# Patient Record
Sex: Female | Born: 1950 | Race: White | Hispanic: No | Marital: Single
Health system: Southern US, Community
[De-identification: ages and names within clinical notes are randomized; demographics above are authoritative.]

---

## 1999-02-22 ENCOUNTER — Ambulatory Visit (HOSPITAL_COMMUNITY): Admission: RE | Admit: 1999-02-22 | Discharge: 1999-02-22 | Payer: Self-pay | Admitting: General Surgery

## 1999-02-22 ENCOUNTER — Encounter (INDEPENDENT_AMBULATORY_CARE_PROVIDER_SITE_OTHER): Payer: Self-pay

## 2000-10-22 ENCOUNTER — Encounter: Payer: Self-pay | Admitting: Family Medicine

## 2000-10-22 ENCOUNTER — Other Ambulatory Visit: Admission: RE | Admit: 2000-10-22 | Discharge: 2000-10-22 | Payer: Self-pay | Admitting: *Deleted

## 2002-04-28 ENCOUNTER — Other Ambulatory Visit: Admission: RE | Admit: 2002-04-28 | Discharge: 2002-04-28 | Payer: Self-pay | Admitting: *Deleted

## 2003-05-26 ENCOUNTER — Encounter: Payer: Self-pay | Admitting: Family Medicine

## 2003-05-26 ENCOUNTER — Other Ambulatory Visit: Admission: RE | Admit: 2003-05-26 | Discharge: 2003-05-26 | Payer: Self-pay | Admitting: *Deleted

## 2005-08-01 ENCOUNTER — Other Ambulatory Visit: Admission: RE | Admit: 2005-08-01 | Discharge: 2005-08-01 | Payer: Self-pay | Admitting: Obstetrics & Gynecology

## 2005-08-01 ENCOUNTER — Encounter: Payer: Self-pay | Admitting: Family Medicine

## 2005-08-08 ENCOUNTER — Encounter: Admission: RE | Admit: 2005-08-08 | Discharge: 2005-08-08 | Payer: Self-pay | Admitting: Family Medicine

## 2005-08-13 ENCOUNTER — Encounter: Admission: RE | Admit: 2005-08-13 | Discharge: 2005-08-13 | Payer: Self-pay | Admitting: Family Medicine

## 2005-09-12 ENCOUNTER — Ambulatory Visit (HOSPITAL_COMMUNITY): Admission: RE | Admit: 2005-09-12 | Discharge: 2005-09-12 | Payer: Self-pay | Admitting: Gastroenterology

## 2005-10-23 ENCOUNTER — Encounter: Admission: RE | Admit: 2005-10-23 | Discharge: 2005-10-23 | Payer: Self-pay | Admitting: Family Medicine

## 2006-01-29 ENCOUNTER — Encounter: Payer: Self-pay | Admitting: Family Medicine

## 2006-02-25 ENCOUNTER — Ambulatory Visit: Payer: Self-pay | Admitting: Family Medicine

## 2006-02-26 ENCOUNTER — Ambulatory Visit: Payer: Self-pay | Admitting: Family Medicine

## 2006-03-02 ENCOUNTER — Encounter: Admission: RE | Admit: 2006-03-02 | Discharge: 2006-03-02 | Payer: Self-pay | Admitting: Family Medicine

## 2006-03-09 ENCOUNTER — Ambulatory Visit: Payer: Self-pay | Admitting: Family Medicine

## 2006-03-13 ENCOUNTER — Ambulatory Visit: Payer: Self-pay | Admitting: Internal Medicine

## 2006-05-06 ENCOUNTER — Ambulatory Visit: Payer: Self-pay | Admitting: Family Medicine

## 2006-05-15 ENCOUNTER — Ambulatory Visit: Payer: Self-pay

## 2006-06-08 ENCOUNTER — Ambulatory Visit: Payer: Self-pay | Admitting: Gastroenterology

## 2006-06-09 ENCOUNTER — Ambulatory Visit: Payer: Self-pay | Admitting: *Deleted

## 2006-06-19 ENCOUNTER — Ambulatory Visit: Payer: Self-pay | Admitting: Gastroenterology

## 2006-06-30 ENCOUNTER — Encounter (INDEPENDENT_AMBULATORY_CARE_PROVIDER_SITE_OTHER): Payer: Self-pay | Admitting: Specialist

## 2006-06-30 ENCOUNTER — Ambulatory Visit: Payer: Self-pay | Admitting: Gastroenterology

## 2006-09-02 DIAGNOSIS — G43909 Migraine, unspecified, not intractable, without status migrainosus: Secondary | ICD-10-CM | POA: Insufficient documentation

## 2007-03-01 ENCOUNTER — Encounter: Payer: Self-pay | Admitting: Family Medicine

## 2007-03-12 IMAGING — US US ABDOMEN COMPLETE
1 series · 14 of 25 positions shown · non-contrast
Comparison: None.

CLINICAL DATA: Epigastric pain with nausea. 
 COMPLETE ABDOMINAL ULTRASOUND:
TECHNIQUE: Complete abdominal ultrasound examination was performed including evaluation of the liver, gallbladder, bile ducts, pancreas, kidneys, spleen, IVC, and abdominal aorta.

[Series 1: us abdomen complete · 0.43mm/px · 14 of 84 slices shown]
[im 1/84]
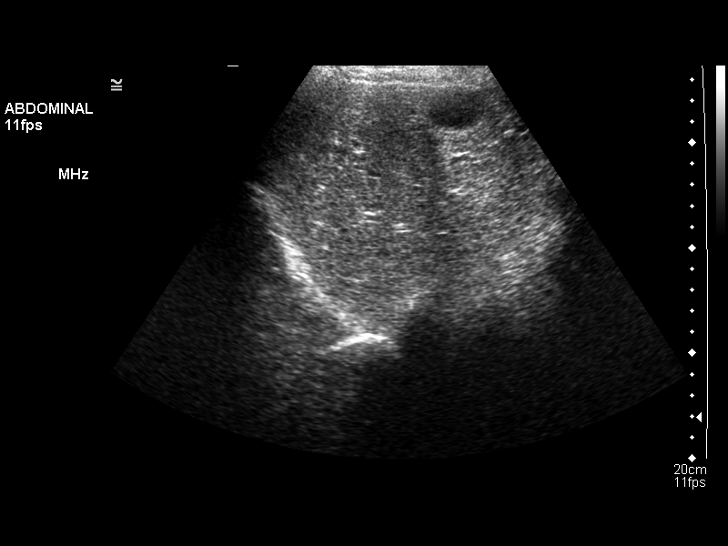
[im 7/84]
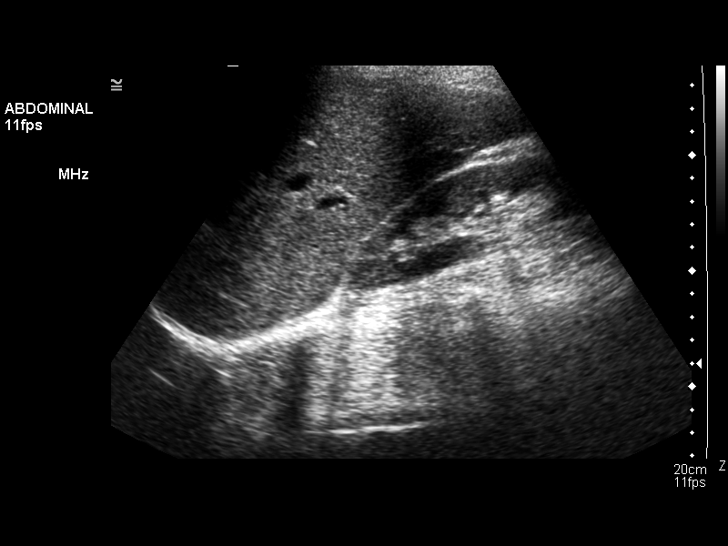
[im 14/84]
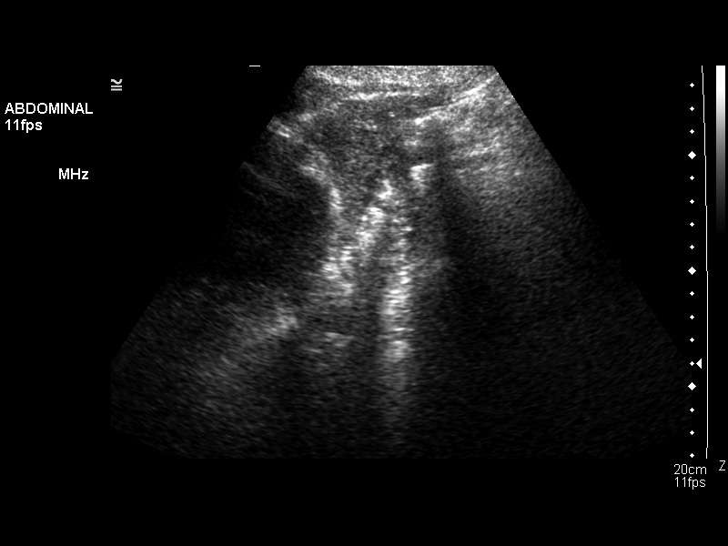
[im 21/84]
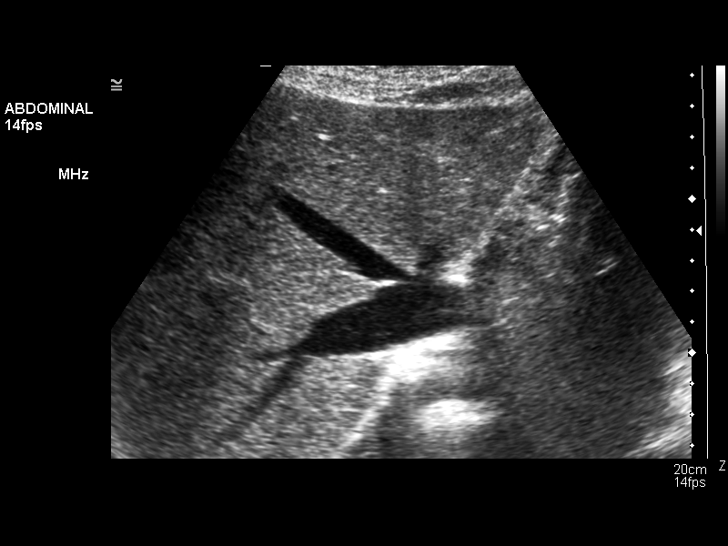
[im 28/84]
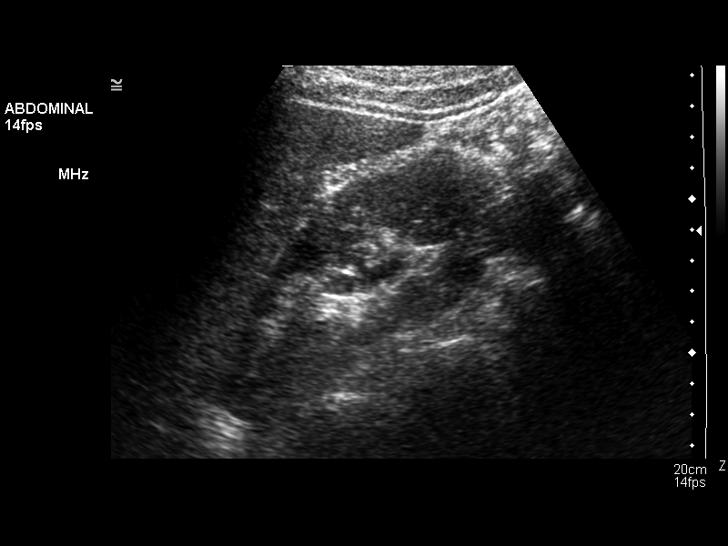
[im 32/84]
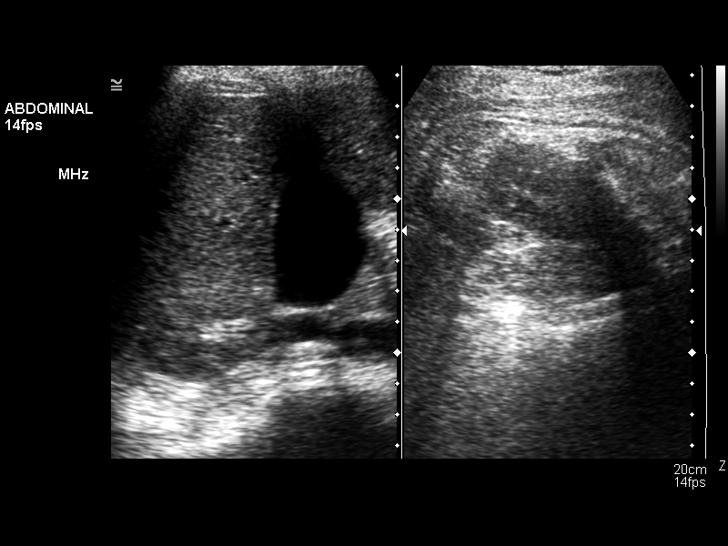
[im 39/84]
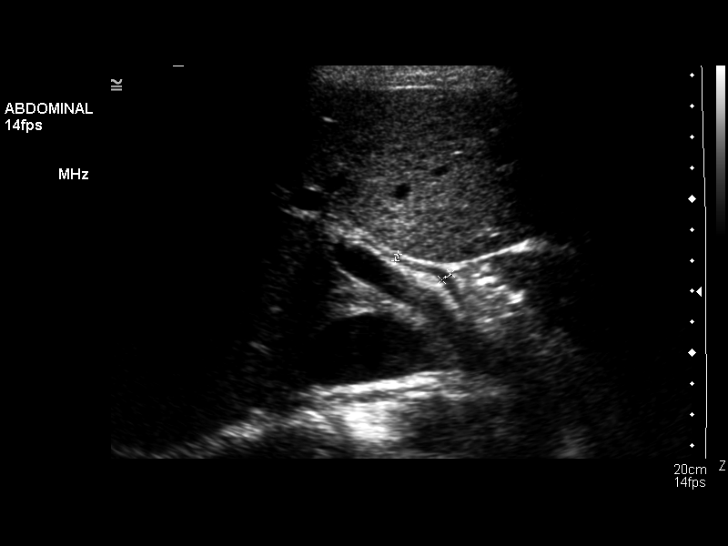
[im 45/84]
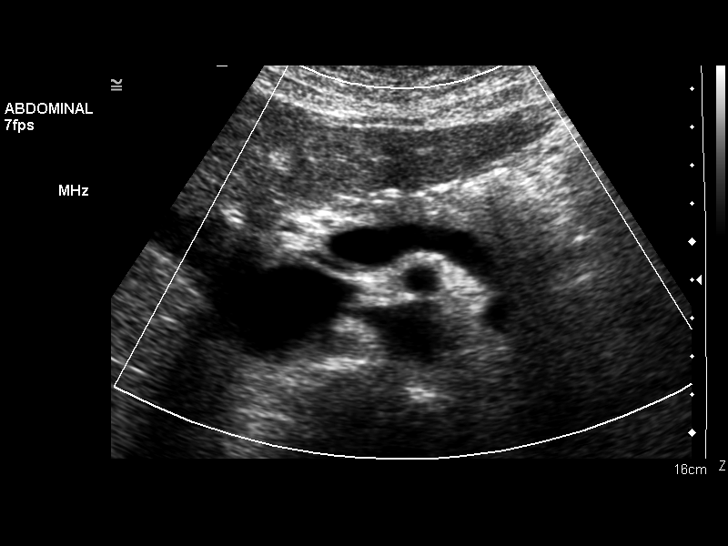
[im 52/84]
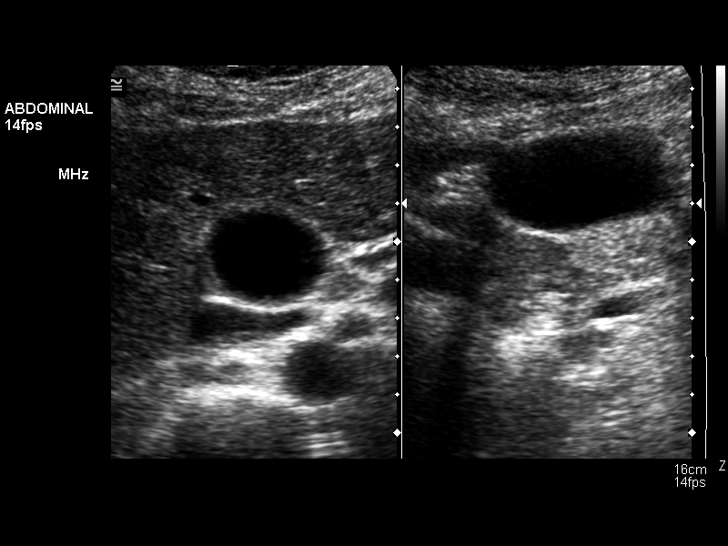
[im 56/84]
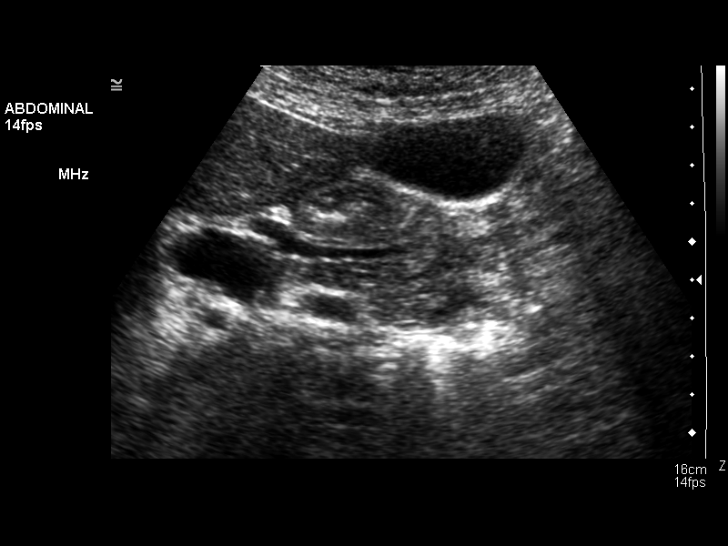
[im 63/84]
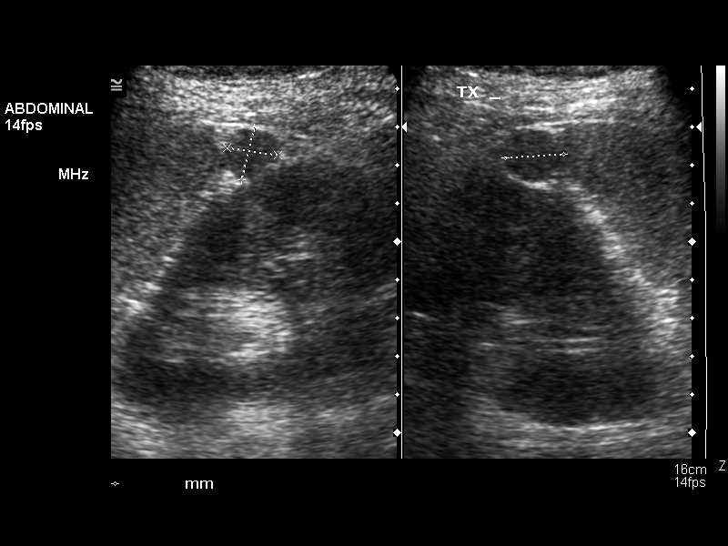
[im 70/84]
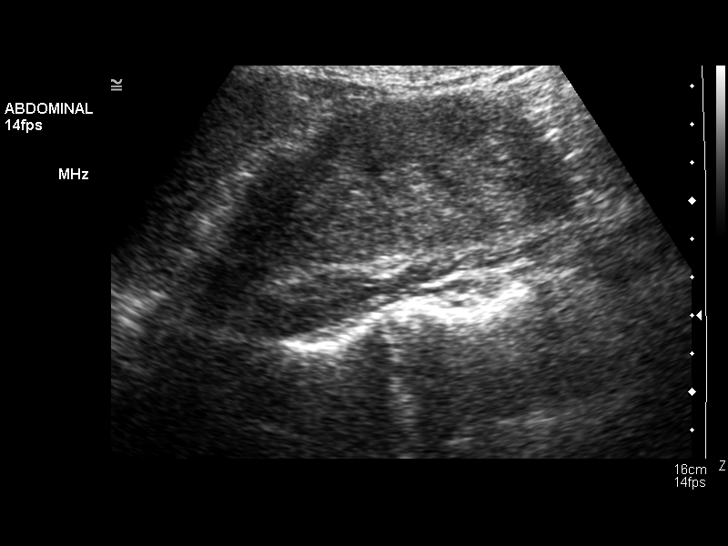
[im 77/84]
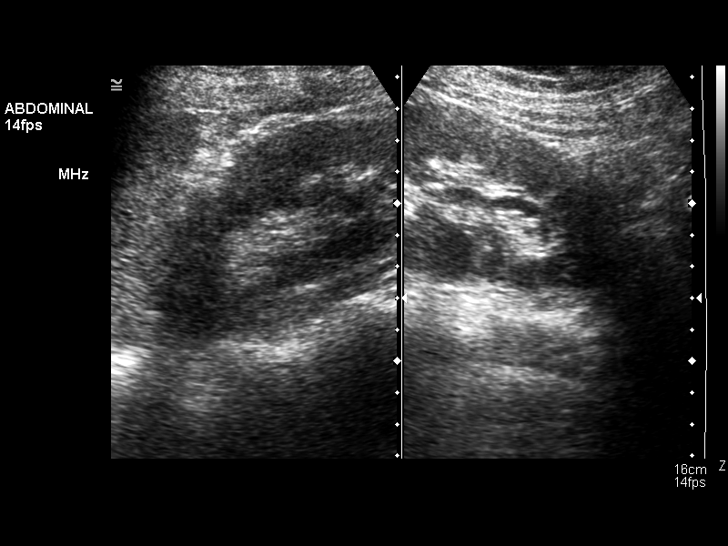
[im 84/84]
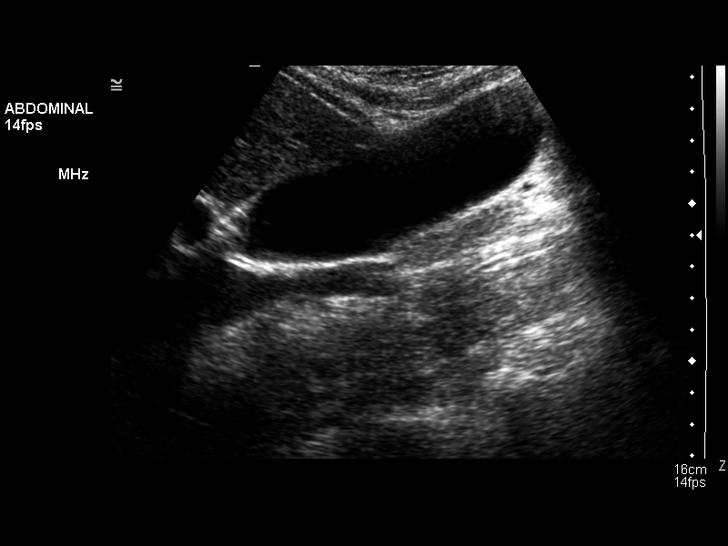

[14 of 25 positions shown; findings below may reference images not displayed]

FINDINGS: Gallbladder and biliary tree normal.  Intrahepatic ducts normal in caliber.  Pancreas, liver, and spleen normal.  There is a probable small accessory spleen measuring about 1.5cm.  Kidney size within normal limits.  No stones or obstruction.  IVC and aorta normal.  No ascites.
IMPRESSION: Normal exam.  There is a probable small accessory splenule.

## 2007-12-03 ENCOUNTER — Encounter: Payer: Self-pay | Admitting: Family Medicine

## 2009-03-15 ENCOUNTER — Other Ambulatory Visit: Admission: RE | Admit: 2009-03-15 | Discharge: 2009-03-15 | Payer: Self-pay | Admitting: Family Medicine

## 2009-03-15 ENCOUNTER — Encounter: Payer: Self-pay | Admitting: Family Medicine

## 2009-03-15 ENCOUNTER — Ambulatory Visit: Payer: Self-pay | Admitting: Family Medicine

## 2009-03-20 ENCOUNTER — Encounter (INDEPENDENT_AMBULATORY_CARE_PROVIDER_SITE_OTHER): Payer: Self-pay | Admitting: *Deleted

## 2009-03-22 ENCOUNTER — Encounter (INDEPENDENT_AMBULATORY_CARE_PROVIDER_SITE_OTHER): Payer: Self-pay | Admitting: *Deleted

## 2009-03-30 ENCOUNTER — Ambulatory Visit: Payer: Self-pay | Admitting: Family Medicine

## 2009-04-03 ENCOUNTER — Encounter (INDEPENDENT_AMBULATORY_CARE_PROVIDER_SITE_OTHER): Payer: Self-pay | Admitting: *Deleted

## 2009-04-03 LAB — CONVERTED CEMR LAB: OCCULT 3: NEGATIVE

## 2009-05-09 ENCOUNTER — Encounter: Payer: Self-pay | Admitting: Family Medicine

## 2009-05-11 ENCOUNTER — Encounter: Payer: Self-pay | Admitting: Family Medicine

## 2010-09-01 LAB — CONVERTED CEMR LAB
ALT: 24 units/L (ref 0–35)
BUN: 11 mg/dL (ref 6–23)
Blood in Urine, dipstick: NEGATIVE
Chloride: 106 meq/L (ref 96–112)
Creatinine, Ser: 0.6 mg/dL (ref 0.4–1.2)
Eosinophils Absolute: 0.1 10*3/uL (ref 0.0–0.7)
Eosinophils Relative: 3 % (ref 0.0–5.0)
Glucose, Urine, Semiquant: NEGATIVE
HCT: 41.5 % (ref 36.0–46.0)
Lymphocytes Relative: 49.7 % — ABNORMAL HIGH (ref 12.0–46.0)
MCV: 92.9 fL (ref 78.0–100.0)
Monocytes Absolute: 0.3 10*3/uL (ref 0.1–1.0)
Neutro Abs: 1.6 10*3/uL (ref 1.4–7.7)
Neutrophils Relative %: 38.5 % — ABNORMAL LOW (ref 43.0–77.0)
Nitrite: NEGATIVE
Total Bilirubin: 0.8 mg/dL (ref 0.3–1.2)
Total CHOL/HDL Ratio: 4
Urobilinogen, UA: NEGATIVE
VLDL: 31 mg/dL (ref 0.0–40.0)
WBC: 4.2 10*3/uL — ABNORMAL LOW (ref 4.5–10.5)
pH: 5

## 2010-12-20 NOTE — Assessment & Plan Note (Signed)
[facility]                          [place] OFFICE NOTE   Elizabeth Church, Elizabeth Church                       MRN:          [id number]  DATE:02/25/2006                            DOB:          November 18, 1950    REASON FOR VISIT:  Chest pain.   HISTORY OF PRESENT ILLNESS:  Elizabeth Church is a 60 year old [place] female whose  English is very limited, but she is here with her son who helped translate.  According to Elizabeth Church, through the translation of her son, she was being  treated for migraines by her neurologist back in May.  She was started on  nortriptyline and Topamax.  One month after the medicines were started, she  started complaining of chest pressure associated with palpitations.  She  went ahead and called her neurologist, who recommended she stop  nortriptyline.  According to the patient, the palpitations resolved but she  continued to have intermittent chest pressure.  The patient describes it as  a chest tightness that lasts 5 to 7 minutes.  It is not associated with  activity, i.e., it can occur at any time.  She does reveal that she has been  under a lot of stress recently, and does not know if this contributes to her  chest discomfort.  She denied any numbness, diaphoresis or dizziness.  She  did report one episode of nausea with chest pain last week.  She denied any  chest discomfort this week.   PAST MEDICAL HISTORY:  1.  Migraine.  2.  Gastroesophageal reflux with a history of H. pylori.   SURGICAL HISTORY:  None.   MEDICATIONS:  1.  Topamax 20 mg.  2.  Metoclopramide 10 mg.  3.  Xanax 0.25 mg daily p.r.n.  4.  Nortriptyline 25 mg, which was discontinued a month ago.   ALLERGIES:  No known drug allergies.   FAMILY HISTORY:  Mother alive and well.  She has one brother who is also  alive and well.   SOCIAL HISTORY:  She works at [employer].  She is married with two  children, denies any tobacco or alcohol use.   REVIEW OF SYSTEMS:  As per HPI.  Additionally, the patient denied any  respiratory, GI, GU, musculoskeletal, neurologic or psychiatric symptoms.   PHYSICAL EXAMINATION:  VITAL SIGNS:  Weight of 153, pulse is 60, blood  pressure 122/70.  GENERAL:  We have a pleasant female in no acute distress, answers questions  appropriately.  HEENT:  Unremarkable.  NECK:  Supple, no lymphadenopathy, carotid bruits or JVD.  No thyromegaly  was noted.  LUNGS:  Clear with good air movement.  HEART:  Regular rate and rhythm with normal S1 and S2, no murmurs, gallops  or rubs on my examination.  ABDOMEN:  Benign.  EXTREMITIES:  No cyanosis, clubbing or edema.   EKG showed a normal sinus rhythm with a ventricular rate of 68.  No ST  elevations or depressions were noted.  No Q waves were appreciated.  No PVCs  or PACs were noted on the rhythm strip.   IMPRESSION:  60 year old female with  24-month history of intermittent chest  pain and palpitations.  The palpitations resolved after discontinuing  nortriptyline, but continue with intermittent chest discomfort.  The  patient's only cardiac risk factor is being postmenopausal.  Given the  limitation on our communication, I feel further cardiac evaluation is  warranted.   PLAN:  1.  I will obtain a basic metabolic profile, lipid panel, TSH and CBC.  2.  Will refer patient to cardiology for further evaluation.  3.  We will also order a chest x-ray PA and lateral.  4.  Precautions were reviewed with both patient and son.                                   Leanne Chang, MD   LA/MedQ  DD:  02/25/2006  DT:  02/26/2006  Job #:  [id number]

## 2010-12-20 NOTE — Assessment & Plan Note (Signed)
[facility]                              CARDIOLOGY OFFICE NOTE   Elizabeth Church, Elizabeth Church                       MRN:          [id number]  DATE:03/13/2006                            DOB:          24-Apr-1951    CARDIOLOGY CONSULTATION:   SUBJECTIVE:  Elizabeth Church is a very pleasant 60 year old [place] female who is  referred to [place] today by Leanne Chang, MD, for the evaluation of chest  pain.  The patient was recently placed on nortriptyline and Topamax for  migraines.  Shortly after this she developed chest pressure and  palpitations.  Her nortriptyline was discontinued but she continued to have  chest pressure.  She does not speak very much [place] and her son is with  her today, who translates.  Apparently this pressure sensation radiates from  her epigastrium up her entire chest and sometimes into her neck.  It can  last anywhere from 10-15 minutes.  She denies any radiation down her arms.  She denies any associated shortness of breath, nausea or diaphoresis.  She  denies any syncope or presyncope.  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  She does not have any exertional  symptoms, nor are her symptoms worsened by exertion.   PAST MEDICAL HISTORY:  1. Migraine headaches.  2. Gastroesophageal reflux disease with a history of H. pylori.  3. She had a recent chest x-ray that was significant for changes      associated by COPD.  She was placed on Advair in response to this.  4. She denies any surgeries.  5. She has a history of osteopenia.   CURRENT MEDICATIONS:  1. Topamax 25 mg two tablets twice a day.  2. Calcium 600 mg twice a day.  3. B12 1 g daily.   ALLERGIES:  She has had side effects to LEXAPRO.   SOCIAL HISTORY:  The patient denies any current tobacco abuse.  She quit 2  years ago.  She has a 20 pack-year history.  She denies alcohol abuse.  She  is a Nature conservation officer at [employer], [place] [address].   She is married and  has 2 grown sons.  She has been in the [place] for the past 9 years  and is formerly from [place]   FAMILY HISTORY:  Insignificant for coronary artery disease.   REVIEW OF SYSTEMS:  Please see HPI.  She denies any fevers, chills, cough,  melena, hematochezia, hematuria, dysuria, dysphagia, odynophagia.  The rest  of the review of systems is negative.   PHYSICAL EXAMINATION:  GENERAL:  She is a well-nourished, well-developed  female in no acute distress.  VITAL SIGNS:  Blood pressure is 112/72, pulse 72, weight 148 pounds.  HEENT:  Head normocephalic, atraumatic.  Eyes:  PERRLA, EOMI, sclerae are  clear.  NECK:  Without lymphadenopathy.  Carotids are 2+ bilaterally without bruits.  ENDOCRINE:  Without thyromegaly.  CARDIAC:  Normal S1, S2.  Regular rate and rhythm without murmurs, clicks,  rubs or gallops.  LUNGS:  Clear to auscultation bilaterally without wheeze, rhonchi or  rales.  ABDOMEN:  Soft, nontender, with normoactive bowel sounds, no organomegaly.  EXTREMITIES:  Without edema.  Calves are soft, nontender bilaterally.  Femoral artery pulses are 2+ bilaterally without bruits.  Dorsalis pedis and  posterior tibialis pulses are 2+ bilaterally.  SKIN:  Warm and dry.  NEUROLOGIC:  She is alert and oriented x3.  Cranial nerves II-XII are  grossly intact.  No focal deficits.   Electrocardiogram reveals sinus rhythm with a heart rate of 72.  Normal  axis.  No acute changes.   IMPRESSION:  1. Atypical chest pain.  2. Migraine headaches.  3. Chronic obstructive pulmonary disease.  4. Osteopenia.  5. Ex-smoker.   PLAN:  The patient was also interviewed and examined by Dr. Tenny Craw.  Her  symptoms are quite atypical and she lacks several risk factors.  She had  recent blood work performed that showed total cholesterol 221, triglycerides  58, HDL of 67.1 and LDL of 131.  At this point in time we have elected to  place her on a proton pump inhibitor with  Protonix 40 mg a day.  We will be  in touch with the patient by phone.  If she shows no improvement with this,  then we will set her up with a stress Myoview study.                                  Tereso Newcomer, PA-C                             Pricilla Riffle, MD, [facility]   SW/MedQ  DD:  03/13/2006  DT:  03/14/2006  Job #:  [id number]   cc:   Leanne Chang, MD

## 2010-12-20 NOTE — Assessment & Plan Note (Signed)
[facility]                           GASTROENTEROLOGY OFFICE NOTE   DOYLENE, SPLINTER                       MRN:          [id number]  DATE:06/08/2006                            DOB:          May 12, 1951    REFERRING PHYSICIAN:  Leanne Chang, M.D.   REASON FOR REFERRAL:  Dr. Blossom Hoops asked me to evaluate Ms. Elizabeth Church in  consultation regarding one year of abdominal pain, and weight loss.   HISTORY OF PRESENT ILLNESS:  Ms. Bergstresser is a pleasant 60 year old [place]  woman who speaks very limited [place]. She is here today with her son for  translation. She has had approximately 1 year of intermittent epigastric  pains. She describes the pain as burning, crampy pain located in her upper  epigastrium. The pain lasts for 15-20 minutes and seems to go away. It seems  to go away on its own. She does believe that eating seems to be the most  reliable cause of the pain, although it can happen not related to eating.  The pain happens 3-4 times a day. She does have some mild nausea with it.  She has no fevers or chills. She has had a decrease in appetite and has  noticed an approximately 30 pound weight loss in the past year since this  pain began.   The pain has been worked up fairly extensively by lab tests, imaging studies  and by gastroenterologist, Dr. Charlott Rakes. Lab tests most recently  done July 2007 show an essentially normal CBC and normal liver tests. She  had a CT scan done January 2007 that was essentially normal except for  prominent mucosa in the fundus of the stomach. She had an ultrasound around  the same time showing a normal biliary system. She was evaluated by Dr.  Doy Mince, gastroenterologist, [facility], who eventually  performed an EGD on her. She tells me this was normal, I do not have that  report here today but we are going to get her to sign a release so we can  get that information. The EGD was done in  March of 2007.   She has tried Protonix taking it in the morning generally not in relation to  eating her food, sometimes before breakfast, sometimes after breakfast,  sometimes not with breakfast at all. She also tried Reglan. Neither of these  seemed to help very much.   REVIEW OF SYSTEMS:  Notable for a 30 pound weight loss in the past year  otherwise essentially normal and is available on her nurse intake sheet.   PAST MEDICAL HISTORY:  None.   CURRENT MEDICATIONS:  None.   SOCIAL HISTORY:  Married with 2 children, nonsmoker, nondrinker.   FAMILY HISTORY:  No colon cancer or colon polyps in the family.   ASSESSMENT/PLAN:  A 60 year old woman with 1 year of daily intermittent  epigastric discomfort.   She has had a pretty thorough workup although a lot of it was almost a year  ago. She has normal liver tests, normal CBC, normal right upper quadrant  ultrasound,relatively normal CT scan. She had what she tells  me was a normal  EGD by Dr. Charlott Rakes in March of 2007. She had a normal gastric  emptying scan in February 2007. Perhaps her symptoms are acid related. She  has not been taking Protonix at the correct time in relation to food and so  I recommended that she begin taking 40 mg p.o. Protonix 20-30 minutes prior  to her dinner meal which is her most reliable meal of the day. I am also  putting her on Levbid 0.375 mg twice daily on the chance this might be  functional/bowel spasm. I am also repeating a CT scan that was done almost a  year ago and given her weight loss I am a bit concerned about the  possibility of occult malignancy. She will get a CT scan of her abdomen and  pelvis with IV and oral contrast. I am also repeating some of her blood work  including a complete metabolic profile and a CBC and thyroid studies. She is  very interested in getting a repeat EGD although without even having her  previous report from Dr. Bosie Clos I think that may be jumping the gun  a bit,  it may come to that.  She will return to see me in 4 weeks' time and sooner  if needed.    ______________________________  Rachael Fee, MD    DPJ/MedQ  DD: 06/08/2006  DT: 06/09/2006  Job #: [id number]   cc:   Leanne Chang, M.D.

## 2015-03-09 ENCOUNTER — Other Ambulatory Visit: Payer: Self-pay | Admitting: Obstetrics and Gynecology

## 2015-03-13 LAB — CYTOLOGY - PAP

## 2015-04-12 ENCOUNTER — Encounter (HOSPITAL_COMMUNITY): Payer: Self-pay

## 2015-10-19 DIAGNOSIS — E7800 Pure hypercholesterolemia, unspecified: Secondary | ICD-10-CM | POA: Insufficient documentation

## 2015-10-19 DIAGNOSIS — B351 Tinea unguium: Secondary | ICD-10-CM | POA: Insufficient documentation

## 2016-05-01 ENCOUNTER — Other Ambulatory Visit: Payer: Self-pay | Admitting: Family Medicine

## 2016-05-02 LAB — CMP12+LP+TP+TSH+6AC+CBC/D/PLT
ALBUMIN: 4.2 g/dL (ref 3.6–4.8)
ALK PHOS: 93 IU/L (ref 39–117)
ALT: 24 IU/L (ref 0–32)
AST: 21 IU/L (ref 0–40)
Albumin/Globulin Ratio: 1.8 (ref 1.2–2.2)
BASOS ABS: 0 10*3/uL (ref 0.0–0.2)
BILIRUBIN TOTAL: 0.4 mg/dL (ref 0.0–1.2)
BUN / CREAT RATIO: 20 (ref 12–28)
BUN: 11 mg/dL (ref 8–27)
Basos: 1 %
CALCIUM: 9.3 mg/dL (ref 8.7–10.3)
CHLORIDE: 100 mmol/L (ref 96–106)
CHOLESTEROL TOTAL: 204 mg/dL — AB (ref 100–199)
Chol/HDL Ratio: 2.2 ratio units (ref 0.0–4.4)
Creatinine, Ser: 0.56 mg/dL — ABNORMAL LOW (ref 0.57–1.00)
EOS (ABSOLUTE): 0.1 10*3/uL (ref 0.0–0.4)
EOS: 2 %
FREE THYROXINE INDEX: 1.8 (ref 1.2–4.9)
GFR calc non Af Amer: 100 mL/min/{1.73_m2} (ref >59)
GFR, EST AFRICAN AMERICAN: 115 mL/min/{1.73_m2} (ref >59)
GGT: 33 IU/L (ref 0–60)
GLUCOSE: 77 mg/dL (ref 65–99)
Globulin, Total: 2.4 g/dL (ref 1.5–4.5)
HDL: 93 mg/dL (ref >39)
HEMOGLOBIN: 13 g/dL (ref 11.1–15.9)
Hematocrit: 40.2 % (ref 34.0–46.6)
IMMATURE GRANULOCYTES: 0 %
IRON: 91 ug/dL (ref 27–139)
Immature Grans (Abs): 0 10*3/uL (ref 0.0–0.1)
LDH: 182 IU/L (ref 119–226)
LDL CALC: 98 mg/dL (ref 0–99)
LYMPHS: 43 %
Lymphocytes Absolute: 1.9 10*3/uL (ref 0.7–3.1)
MCH: 29.5 pg (ref 26.6–33.0)
MCHC: 32.3 g/dL (ref 31.5–35.7)
MCV: 91 fL (ref 79–97)
MONOCYTES: 6 %
Monocytes Absolute: 0.3 10*3/uL (ref 0.1–0.9)
NEUTROS PCT: 48 %
Neutrophils Absolute: 2.1 10*3/uL (ref 1.4–7.0)
PLATELETS: 256 10*3/uL (ref 150–379)
Phosphorus: 3 mg/dL (ref 2.5–4.5)
Potassium: 3.8 mmol/L (ref 3.5–5.2)
RBC: 4.4 x10E6/uL (ref 3.77–5.28)
RDW: 13.2 % (ref 12.3–15.4)
Sodium: 141 mmol/L (ref 134–144)
T3 Uptake Ratio: 25 % (ref 24–39)
T4, Total: 7.3 ug/dL (ref 4.5–12.0)
TOTAL PROTEIN: 6.6 g/dL (ref 6.0–8.5)
TSH: 1.89 u[IU]/mL (ref 0.450–4.500)
Triglycerides: 65 mg/dL (ref 0–149)
URIC ACID: 3 mg/dL (ref 2.5–7.1)
VLDL CHOLESTEROL CAL: 13 mg/dL (ref 5–40)
WBC: 4.4 10*3/uL (ref 3.4–10.8)

## 2016-05-02 LAB — HGB A1C W/O EAG: Hgb A1c MFr Bld: 5.4 % (ref 4.8–5.6)

## 2016-08-22 DIAGNOSIS — K219 Gastro-esophageal reflux disease without esophagitis: Secondary | ICD-10-CM | POA: Insufficient documentation

## 2017-04-30 ENCOUNTER — Ambulatory Visit: Payer: Self-pay | Admitting: *Deleted

## 2017-04-30 VITALS — BP 123/82 | Ht 64.0 in | Wt 180.0 lb

## 2017-04-30 DIAGNOSIS — R7989 Other specified abnormal findings of blood chemistry: Secondary | ICD-10-CM

## 2017-04-30 DIAGNOSIS — Z0000 Encounter for general adult medical examination without abnormal findings: Secondary | ICD-10-CM

## 2017-04-30 NOTE — Progress Notes (Signed)
[employer] insurance premium discount evaluation: Labs Drawn. Replacements ROI form signed. Tobacco Free Attestation form signed.  Forms placed in paper chart.  Ok to route results to pcp per pt. 

## 2017-05-01 LAB — CMP12+LP+TP+TSH+6AC+CBC/D/PLT
ALBUMIN: 4.4 g/dL (ref 3.6–4.8)
ALK PHOS: 103 IU/L (ref 39–117)
ALT: 22 IU/L (ref 0–32)
AST: 21 IU/L (ref 0–40)
Albumin/Globulin Ratio: 2.1 (ref 1.2–2.2)
BASOS ABS: 0 10*3/uL (ref 0.0–0.2)
BASOS: 0 %
BILIRUBIN TOTAL: 0.4 mg/dL (ref 0.0–1.2)
BUN / CREAT RATIO: 16 (ref 12–28)
BUN: 10 mg/dL (ref 8–27)
CHOLESTEROL TOTAL: 188 mg/dL (ref 100–199)
Calcium: 9.2 mg/dL (ref 8.7–10.3)
Chloride: 103 mmol/L (ref 96–106)
Chol/HDL Ratio: 2.3 ratio (ref 0.0–4.4)
Creatinine, Ser: 0.63 mg/dL (ref 0.57–1.00)
EOS (ABSOLUTE): 0 10*3/uL (ref 0.0–0.4)
Eos: 1 %
Estimated CHD Risk: <0.5 times avg. (ref 0.0–1.0)
FREE THYROXINE INDEX: 2 (ref 1.2–4.9)
GFR calc non Af Amer: 95 mL/min/{1.73_m2} (ref >59)
GFR, EST AFRICAN AMERICAN: 110 mL/min/{1.73_m2} (ref >59)
GGT: 33 IU/L (ref 0–60)
GLOBULIN, TOTAL: 2.1 g/dL (ref 1.5–4.5)
Glucose: 87 mg/dL (ref 65–99)
HDL: 81 mg/dL (ref >39)
HEMATOCRIT: 39.8 % (ref 34.0–46.6)
Hemoglobin: 13.2 g/dL (ref 11.1–15.9)
Immature Grans (Abs): 0 10*3/uL (ref 0.0–0.1)
Immature Granulocytes: 0 %
Iron: 88 ug/dL (ref 27–139)
LDH: 149 IU/L (ref 119–226)
LDL CALC: 94 mg/dL (ref 0–99)
LYMPHS: 43 %
Lymphocytes Absolute: 1.8 10*3/uL (ref 0.7–3.1)
MCH: 29.6 pg (ref 26.6–33.0)
MCHC: 33.2 g/dL (ref 31.5–35.7)
MCV: 89 fL (ref 79–97)
Monocytes Absolute: 0.3 10*3/uL (ref 0.1–0.9)
Monocytes: 7 %
NEUTROS PCT: 49 %
Neutrophils Absolute: 2 10*3/uL (ref 1.4–7.0)
PLATELETS: 242 10*3/uL (ref 150–379)
Phosphorus: 2.9 mg/dL (ref 2.5–4.5)
Potassium: 4.2 mmol/L (ref 3.5–5.2)
RBC: 4.46 x10E6/uL (ref 3.77–5.28)
RDW: 13 % (ref 12.3–15.4)
SODIUM: 140 mmol/L (ref 134–144)
T3 UPTAKE RATIO: 25 % (ref 24–39)
T4, Total: 8.1 ug/dL (ref 4.5–12.0)
TRIGLYCERIDES: 64 mg/dL (ref 0–149)
TSH: 1.5 u[IU]/mL (ref 0.450–4.500)
Total Protein: 6.5 g/dL (ref 6.0–8.5)
Uric Acid: 2.7 mg/dL (ref 2.5–7.1)
VLDL CHOLESTEROL CAL: 13 mg/dL (ref 5–40)
WBC: 4.2 10*3/uL (ref 3.4–10.8)

## 2017-05-01 LAB — VITAMIN D 25 HYDROXY (VIT D DEFICIENCY, FRACTURES): Vit D, 25-Hydroxy: 27.9 ng/mL — ABNORMAL LOW (ref 30.0–100.0)

## 2017-05-01 LAB — HGB A1C W/O EAG: HEMOGLOBIN A1C: 5.4 % (ref 4.8–5.6)

## 2017-05-01 NOTE — Progress Notes (Signed)
Results reviewed with pt. Vit D slightly low. Taking supplement at home  daily. Advised she could increase to  daily and also 5min/day sunlight on skin. All other labs WNL. Diet and exercise recommendations for wt management (BMI 31) discussed. Routine f/u with pcp. Copy of labs provided to pt. Results routed to pcp per pt request. No further questions concerns.

## 2018-10-22 ENCOUNTER — Other Ambulatory Visit: Payer: Self-pay

## 2018-10-22 ENCOUNTER — Ambulatory Visit: Payer: Self-pay | Admitting: *Deleted

## 2018-10-22 VITALS — BP 119/79 | HR 70 | Ht 63.0 in | Wt 176.0 lb

## 2018-10-22 DIAGNOSIS — R7989 Other specified abnormal findings of blood chemistry: Secondary | ICD-10-CM

## 2018-10-22 DIAGNOSIS — Z0000 Encounter for general adult medical examination without abnormal findings: Secondary | ICD-10-CM

## 2018-10-22 DIAGNOSIS — Z79899 Other long term (current) drug therapy: Secondary | ICD-10-CM

## 2018-10-22 NOTE — Progress Notes (Signed)
[employer] insurance premium discount evaluation: Labs Drawn. Replacements ROI form signed. Tobacco Free Attestation form signed.  Forms placed in paper chart. Okay to route results to Rikki Spearing MD per pt.

## 2018-10-23 LAB — CMP12+LP+TP+TSH+6AC+CBC/D/PLT
A/G RATIO: 1.9 (ref 1.2–2.2)
ALBUMIN: 4.3 g/dL (ref 3.8–4.8)
ALT: 20 IU/L (ref 0–32)
AST: 19 IU/L (ref 0–40)
Alkaline Phosphatase: 96 IU/L (ref 39–117)
BASOS ABS: 0 10*3/uL (ref 0.0–0.2)
BILIRUBIN TOTAL: 0.5 mg/dL (ref 0.0–1.2)
BUN/Creatinine Ratio: 26 (ref 12–28)
BUN: 16 mg/dL (ref 8–27)
Basos: 1 %
CHOLESTEROL TOTAL: 207 mg/dL — AB (ref 100–199)
CREATININE: 0.61 mg/dL (ref 0.57–1.00)
Calcium: 9.4 mg/dL (ref 8.7–10.3)
Chloride: 104 mmol/L (ref 96–106)
Chol/HDL Ratio: 2.6 ratio (ref 0.0–4.4)
EOS (ABSOLUTE): 0.1 10*3/uL (ref 0.0–0.4)
Eos: 1 %
Free Thyroxine Index: 1.8 (ref 1.2–4.9)
GFR calc Af Amer: 110 mL/min/{1.73_m2} (ref >59)
GFR, EST NON AFRICAN AMERICAN: 95 mL/min/{1.73_m2} (ref >59)
GGT: 31 IU/L (ref 0–60)
GLOBULIN, TOTAL: 2.3 g/dL (ref 1.5–4.5)
GLUCOSE: 84 mg/dL (ref 65–99)
HDL: 81 mg/dL (ref >39)
HEMOGLOBIN: 13.2 g/dL (ref 11.1–15.9)
Hematocrit: 39.5 % (ref 34.0–46.6)
IMMATURE GRANULOCYTES: 0 %
IRON: 94 ug/dL (ref 27–139)
Immature Grans (Abs): 0 10*3/uL (ref 0.0–0.1)
LDH: 170 IU/L (ref 119–226)
LDL Calculated: 111 mg/dL — ABNORMAL HIGH (ref 0–99)
LYMPHS ABS: 1.8 10*3/uL (ref 0.7–3.1)
Lymphs: 43 %
MCH: 29.2 pg (ref 26.6–33.0)
MCHC: 33.4 g/dL (ref 31.5–35.7)
MCV: 87 fL (ref 79–97)
Monocytes Absolute: 0.3 10*3/uL (ref 0.1–0.9)
Monocytes: 6 %
Neutrophils Absolute: 2 10*3/uL (ref 1.4–7.0)
Neutrophils: 49 %
PHOSPHORUS: 3.7 mg/dL (ref 3.0–4.3)
PLATELETS: 237 10*3/uL (ref 150–450)
Potassium: 4.1 mmol/L (ref 3.5–5.2)
RBC: 4.52 x10E6/uL (ref 3.77–5.28)
RDW: 12.4 % (ref 11.7–15.4)
Sodium: 140 mmol/L (ref 134–144)
T3 UPTAKE RATIO: 23 % — AB (ref 24–39)
T4 TOTAL: 7.7 ug/dL (ref 4.5–12.0)
TOTAL PROTEIN: 6.6 g/dL (ref 6.0–8.5)
TSH: 1.56 u[IU]/mL (ref 0.450–4.500)
Triglycerides: 76 mg/dL (ref 0–149)
URIC ACID: 2.4 mg/dL — AB (ref 2.5–7.1)
VLDL CHOLESTEROL CAL: 15 mg/dL (ref 5–40)
WBC: 4.2 10*3/uL (ref 3.4–10.8)

## 2018-10-23 LAB — VITAMIN D 25 HYDROXY (VIT D DEFICIENCY, FRACTURES): Vit D, 25-Hydroxy: 16.4 ng/mL — ABNORMAL LOW (ref 30.0–100.0)

## 2018-10-23 LAB — MAGNESIUM: MAGNESIUM: 1.9 mg/dL (ref 1.6–2.3)

## 2018-10-23 LAB — HGB A1C W/O EAG: HEMOGLOBIN A1C: 5.5 % (ref 4.8–5.6)

## 2018-10-29 NOTE — Progress Notes (Signed)
noted 

## 2018-12-21 ENCOUNTER — Telehealth: Payer: Self-pay | Admitting: Registered Nurse

## 2018-12-21 ENCOUNTER — Encounter: Payer: Self-pay | Admitting: Registered Nurse

## 2018-12-21 NOTE — Telephone Encounter (Signed)
Patient having left lower posterior leg pain from prolonged standing at work.  New shoes bought in Jan 2020.  Denied rash/bruising/swelling/trauma.  Stated epsom salt soaks helping some after work.  Discussed antifatigue mat at workstation, icing 15 minutes qid prn, raised heel shoes, gastrocnemius stretches QID/ankle AROM and shin stretches QID (heel raise/toe raises), avoiding shoes rubbing on achilles tendon, continue epsom salt soaks and schedule appt with me for re-evaluation if no improvement with plan of care.  Consider biofreeze, motrin 800mg  po BID with food x 2 weeks and/or tylenol 1000mg  po QID prn pain.  Consider rotating shoes and not wearing same pair every day also.  Discussed probably achilles tendonitis, new shoes may be rubbing distal site more than old pair.  Patient verbalized understanding information/instructions, agreed with plan of care and had no further questions at this time.

## 2018-12-28 ENCOUNTER — Telehealth: Payer: Self-pay | Admitting: Registered Nurse

## 2018-12-28 ENCOUNTER — Encounter: Payer: Self-pay | Admitting: Registered Nurse

## 2018-12-28 NOTE — Telephone Encounter (Signed)
Patient reported pain decreased over the weekend with 3 days off for holiday weekend.  She has been stretching and it is helping also.  Wearing her sneakers and using antifatigue mat at her workstation as stands entire shift.  Reiterated ice, tylenol prn, stretches throughout her shift e.g. gastrocnemius stretches/ankle AROM/toe lifts and shin stretch.  Patient to follow up in clinic if new or worsening symptoms.

## 2019-01-24 ENCOUNTER — Other Ambulatory Visit: Payer: Self-pay | Admitting: Rehabilitation

## 2019-01-24 DIAGNOSIS — S32000A Wedge compression fracture of unspecified lumbar vertebra, initial encounter for closed fracture: Secondary | ICD-10-CM

## 2019-02-02 ENCOUNTER — Other Ambulatory Visit: Payer: Self-pay | Admitting: Rehabilitation

## 2019-02-12 ENCOUNTER — Ambulatory Visit
Admission: RE | Admit: 2019-02-12 | Discharge: 2019-02-12 | Disposition: A | Discharge status: Home / Self Care | Payer: PRIVATE HEALTH INSURANCE | Source: Ambulatory Visit | Attending: Rehabilitation | Admitting: Rehabilitation

## 2019-02-12 ENCOUNTER — Other Ambulatory Visit: Payer: Self-pay

## 2019-02-12 DIAGNOSIS — S32000A Wedge compression fracture of unspecified lumbar vertebra, initial encounter for closed fracture: Secondary | ICD-10-CM

## 2020-10-10 ENCOUNTER — Other Ambulatory Visit: Payer: Self-pay | Admitting: Family Medicine

## 2020-10-10 DIAGNOSIS — Z1231 Encounter for screening mammogram for malignant neoplasm of breast: Secondary | ICD-10-CM

## 2020-10-11 ENCOUNTER — Other Ambulatory Visit: Payer: Self-pay | Admitting: Family Medicine

## 2020-10-11 DIAGNOSIS — M810 Age-related osteoporosis without current pathological fracture: Secondary | ICD-10-CM

## 2023-01-18 NOTE — Progress Notes (Signed)
noted
